# Patient Record
Sex: Female | Born: 1980 | Race: White | Hispanic: No | Marital: Single | Smoking: Current every day smoker
Health system: Southern US, Community
[De-identification: ages and names within clinical notes are randomized; demographics above are authoritative.]

---

## 2015-04-01 ENCOUNTER — Ambulatory Visit (INDEPENDENT_AMBULATORY_CARE_PROVIDER_SITE_OTHER): Payer: BLUE CROSS/BLUE SHIELD | Admitting: Physician Assistant

## 2015-04-01 VITALS — BP 105/78 | HR 82 | Temp 97.6°F | Resp 18 | Wt 196.4 lb

## 2015-04-01 DIAGNOSIS — S39012A Strain of muscle, fascia and tendon of lower back, initial encounter: Secondary | ICD-10-CM | POA: Diagnosis not present

## 2015-04-01 MED ORDER — CYCLOBENZAPRINE HCL 5 MG PO TABS: 5.0000 mg | ORAL_TABLET | Freq: Three times a day (TID) | ORAL | Status: DC | PRN

## 2015-04-01 MED ORDER — MELOXICAM 15 MG PO TABS: 15.0000 mg | ORAL_TABLET | Freq: Every day | ORAL | Status: AC

## 2015-04-01 NOTE — Progress Notes (Signed)
Subjective:    Patient ID: Bailey Dougherty, female    DOB: 09/25/1981, 34 y.o.   MRN: [id number]  HPI  This is a 34 year old female with no PMH who is presenting with lower back pain x 1 week. Pain is bilateral but worse on right side. Report she has had a kidney infection before and this feels similar but not having any urinary symptoms like before. 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 Pain described as constant and sharp. At times it shoots down both legs. She has also had pain shoot up her back. Pain shoots down to level of knees. Denies paresthesias, weakness, problems with bowel or bladder, dysuria, urinary frequency, abdominal, fever, chills, N/V. No back injury before. She has tried ibuprofen and heat with some relief.  Review of Systems  Constitutional: Negative for fever and chills.  Gastrointestinal: Negative for nausea, vomiting, abdominal pain and diarrhea.  Genitourinary: Negative for dysuria, frequency, hematuria, vaginal bleeding and vaginal discharge.  Musculoskeletal: Positive for back pain. Negative for neck pain.  Skin: Negative for color change and rash.  Neurological: Negative for weakness and numbness.  Hematological: Negative for adenopathy.  Psychiatric/Behavioral: Positive for sleep disturbance.    There are no active problems to display for this patient.  Prior to Admission medications   Not on File   No Known Allergies  Patient's social and family history were reviewed.     Objective:   Physical Exam  Constitutional: She is oriented to person, place, and time. She appears well-developed and well-nourished. No distress.  HENT:  Head: Normocephalic and atraumatic.  Right Ear: Hearing normal.  Left Ear: Hearing normal.  Nose: Nose normal.  Eyes: Conjunctivae and lids are normal. Right eye exhibits no discharge. Left eye exhibits no discharge. No scleral icterus.  Cardiovascular: Normal rate,  regular rhythm, normal heart sounds and normal pulses.   No murmur heard. Pulmonary/Chest: Effort normal and breath sounds normal. No respiratory distress. She has no wheezes. She has no rhonchi. She has no rales.  Abdominal: Soft. Normal appearance. There is no tenderness. There is no CVA tenderness.  Musculoskeletal:       Lumbar back: She exhibits decreased range of motion (flexion and extension d/t pain). She exhibits no tenderness and no bony tenderness.  Straight leg raise negative for sciatic pain but did reproduce back pain.  Neurological: She is alert and oriented to person, place, and time. She has normal strength and normal reflexes. No sensory deficit. Gait normal.  Skin: Skin is warm, dry and intact. No lesion and no rash noted.  Psychiatric: She has a normal mood and affect. Her speech is normal and behavior is normal. Thought content normal.   BP 105/78 mmHg  Pulse 82  Temp(Src) 97.6 F (36.4 C) (Oral)  Resp 18  Wt 196 lb 6.4 oz (89.086 kg)  SpO2 98%  LMP 02/16/2015     Assessment & Plan:  1. Lumbar strain, initial encounter Pt likely has lumbar strain. Flexeril and mobic prescribed. Counseled on heat, gentle massage and gentle stretching. Wrote work note to stay out rest of week. She will return if symptoms are not improving in 2 weeks.  - cyclobenzaprine (FLEXERIL) 5 MG tablet; Take 1 tablet (5 mg total) by mouth 3 (three) times daily as needed for muscle spasms.  Dispense: 30 tablet; Refill: 0 - meloxicam (MOBIC) 15 MG tablet; Take 1 tablet (15 mg total)  by mouth daily.  Dispense: 30 tablet; Refill: 0   Roswell MinersNicole V. Dyke BrackettBush, PA-C, [facility]  04/01/2015

## 2015-04-01 NOTE — Patient Instructions (Signed)
Take mobic once a day - no other nsaids with this. You may take tylenol on top of mobic for additional pain control. Flexeril three times a day for muscle spasm. May make you drowsy. If makes you too tired, just take at night. Continue heat. Gentle massage and gentle stretching can help. Return if your symptoms are not improving in 2 weeks.

## 2015-04-08 ENCOUNTER — Ambulatory Visit (INDEPENDENT_AMBULATORY_CARE_PROVIDER_SITE_OTHER): Payer: BLUE CROSS/BLUE SHIELD | Admitting: Physician Assistant

## 2015-04-08 ENCOUNTER — Ambulatory Visit (INDEPENDENT_AMBULATORY_CARE_PROVIDER_SITE_OTHER): Payer: BLUE CROSS/BLUE SHIELD

## 2015-04-08 VITALS — BP 102/84 | HR 85 | Temp 98.5°F | Resp 16 | Ht 68.0 in | Wt 193.0 lb

## 2015-04-08 DIAGNOSIS — M5440 Lumbago with sciatica, unspecified side: Secondary | ICD-10-CM

## 2015-04-08 NOTE — Patient Instructions (Signed)
Take 2 flexerils in the morning with 1 mobic. Take 1 flexeril in the afternoon and 1 flexeril in evening. You may take tylenol 1000 mg three times a day. If you need another work note, call and let me know. If you are not getting better in 4 weeks return and we will consider MRI.

## 2015-04-08 NOTE — Progress Notes (Signed)
Subjective:    Patient ID: Bailey Dougherty, female    DOB: [birth year], 34 y.o.   MRN: [id number]  HPI  This is a 34 year old female who is presenting for follow up back pain. I saw her 8 days ago for same complaint. Back pain has now been present for 2 weeks. She has been taking Mobic and Flexeril 5 mg 3 times a day for the past one week. She reports she cannot tell a difference. Her friend gave her Flexeril 10 mg which she tried once and states it helped a good bit. Heat helps. Most of her pain is in the morning and gets better throughout the day. Pain worsened by movement and turning over in the bed. She gets occasional shooting pain into her buttocks and down the side of her thighs to level of knee. She denies weakness, paresthesias, problems with bowel or bladder. She has been out of work for the past one week. She is a dialysis tech. Overall her pain is improved but not resolved and she is worried about worsening it by going back to work. She is wondering if she needs an MRI.  Review of Systems  Constitutional: Negative for fever and chills.  Respiratory: Negative for cough and shortness of breath.   Gastrointestinal: Negative for nausea, vomiting, abdominal pain and constipation.  Genitourinary: Negative for dysuria and hematuria.  Musculoskeletal: Positive for back pain. Negative for neck pain.  Skin: Negative for color change and rash.  Neurological: Negative for weakness and numbness.  Hematological: Negative for adenopathy.  Psychiatric/Behavioral: Positive for sleep disturbance.    There are no active problems to display for this patient.  Prior to Admission medications   Medication Sig Start Date End Date Taking? Authorizing Provider  cyclobenzaprine (FLEXERIL) 5 MG tablet Take 1 tablet (5 mg total) by mouth 3 (three) times daily as needed for muscle spasms. 04/01/15  Yes Lanier Clam V, PA-C  meloxicam (MOBIC) 15 MG tablet Take 1 tablet (15 mg total) by mouth daily. 04/01/15  Yes  Lanier Clam V, PA-C   No Known Allergies  Patient's social and family history were reviewed.     Objective:   Physical Exam  Constitutional: She is oriented to person, place, and time. She appears well-developed and well-nourished. No distress.  HENT:  Head: Normocephalic and atraumatic.  Right Ear: Hearing normal.  Left Ear: Hearing normal.  Nose: Nose normal.  Eyes: Conjunctivae and lids are normal. Right eye exhibits no discharge. Left eye exhibits no discharge. No scleral icterus.  Cardiovascular: Normal rate, regular rhythm, normal heart sounds, intact distal pulses and normal pulses.   No murmur heard. Pulmonary/Chest: Effort normal and breath sounds normal. No respiratory distress. She has no wheezes. She has no rhonchi. She has no rales.  Abdominal: Soft. Normal appearance. There is no tenderness. There is no CVA tenderness.  Musculoskeletal:       Lumbar back: She exhibits decreased range of motion (flexion and extension). She exhibits no tenderness and no bony tenderness.  Straight leg raise negative. Last visit straight leg raise produced significant low back pain. This visit, it produced mild low back pain.  Neurological: She is alert and oriented to person, place, and time. She has normal strength and normal reflexes. No sensory deficit. Gait normal.  Skin: Skin is warm, dry and intact. No lesion and no rash noted.  Psychiatric: She has a normal mood and affect. Her speech is normal and behavior is normal. Thought content normal.  BP 102/84 mmHg  Pulse 85  Temp(Src) 98.5 F (36.9 C) (Oral)  Resp 16  Ht 5\' 8"  (1.727 m)  Wt 193 lb (87.544 kg)  BMI 29.35 kg/m2  SpO2 96%  LMP 02/16/2015  [facility] reading (PRIMARY) by  Dr. Clelia CroftShaw: negative     Assessment & Plan:  1. Midline low back pain with sciatica, sciatica laterality unspecified Radiographic negative. Neuro exam normal. Patient has improvement in symptoms. She will continue with Mobic once a day. She will take  Flexeril 10 mg in the morning, 5 mg in the afternoon and evening. She will add Tylenol 1000 mg 3 times a day. She will continue to stay out of work for the next one week. If she needs additional time off work she will call. If no improvement in 4 weeks, will send for MRI. I expect full recovery.  - DG Lumbar Spine Complete; Future   [place] MinersNicole V. Dyke BrackettBush, PA-C, [facility]  04/09/2015

## 2015-04-16 ENCOUNTER — Telehealth: Payer: Self-pay

## 2015-04-16 DIAGNOSIS — S39012A Strain of muscle, fascia and tendon of lower back, initial encounter: Secondary | ICD-10-CM

## 2015-04-16 NOTE — Telephone Encounter (Signed)
PATIENT STATES SHE SAW NICOLE BUSH ABOUT 1 WEEK AGO FOR BACK PAIN. NICOLE TOLD HER TO CALL HER BACK IF SHE NEEDED ANYTHING. Leba NEEDS A REFILL ON FLEXORIL 5 MG AND MOBIC 15 MG. SHE ALSO NEEDS NICOLE TO WRITE HER A NOTE TO BE OUT OF WORK FROM WED. 5/4/216 UNTIL SAT. 04/20/2015. PLEASE CALL HER WHEN IT HAS BEEN WRITTEN. BEST PHONE [phone] (CELL)  PHARMACY CHOICE IS [facility] ON [address].  MBC

## 2015-04-16 NOTE — Telephone Encounter (Signed)
nicole- Please advise on refills and note out of work.

## 2015-04-16 NOTE — Telephone Encounter (Signed)
Pt states she have some FMLA papers to bring over for Lanier Clamicole Bush, was told to bring them over along with $15.00 and give to Lauren or [place] for [place] However she would like Joni Reiningicole to call her at [phone]

## 2015-04-16 NOTE — Telephone Encounter (Signed)
lmom to cb. 

## 2015-04-17 ENCOUNTER — Telehealth: Payer: Self-pay

## 2015-04-17 DIAGNOSIS — M5442 Lumbago with sciatica, left side: Secondary | ICD-10-CM

## 2015-04-17 NOTE — Telephone Encounter (Signed)
Pt dropped off FMLA ppw on 04/16/15 for completion by [name] in 5-7 business days.  Once Completed please return to disability box located by checkout at [address]. Blank copies have been scanned into chart under release #[id number]. Once returned Jasmine or myself will scan completed forms into Release #[id number], fufill release, and contact patient @ [phone] for pick up.

## 2015-04-19 MED ORDER — CYCLOBENZAPRINE HCL 10 MG PO TABS: 10.0000 mg | ORAL_TABLET | Freq: Three times a day (TID) | ORAL | Status: AC | PRN

## 2015-04-19 MED ORDER — CYCLOBENZAPRINE HCL 5 MG PO TABS
5.0000 mg | ORAL_TABLET | Freq: Three times a day (TID) | ORAL | Status: DC | PRN
Start: 2015-04-19 — End: 2015-04-19

## 2015-04-19 NOTE — Telephone Encounter (Signed)
Rx printed instead of sending electronically. I faxed to pharm.

## 2015-04-19 NOTE — Telephone Encounter (Signed)
Flexeril refilled. Asked pt to call me back and let me know how her pain is progressing before I write her a work note. Will fill out FMLA after we speak as well.

## 2015-04-21 ENCOUNTER — Telehealth: Payer: Self-pay | Admitting: *Deleted

## 2015-04-21 NOTE — Telephone Encounter (Signed)
Bailey Dougherty was called and an VM was left with number [phone] to advised her that the pt Bailey Dougherty paperwork was ready for pickup.    Bailey Dougherty gave [name] and I [name] an verbally approval that Bailey Dougherty was allowed to pick up her paper work.

## 2015-04-22 NOTE — Telephone Encounter (Signed)
Release closed on 5/8 by [facility], states patient came to office and picked up forms. Unfortunately, no forms scanned into Patient chart.

## 2015-04-26 DIAGNOSIS — Z0271 Encounter for disability determination: Secondary | ICD-10-CM

## 2015-06-19 NOTE — Telephone Encounter (Signed)
error 

## 2017-05-11 ENCOUNTER — Emergency Department (HOSPITAL_COMMUNITY): Payer: Self-pay

## 2017-05-11 ENCOUNTER — Encounter (HOSPITAL_COMMUNITY): Payer: Self-pay | Admitting: Emergency Medicine

## 2017-05-11 ENCOUNTER — Emergency Department (HOSPITAL_COMMUNITY)
Admission: EM | Admit: 2017-05-11 | Discharge: 2017-05-11 | Disposition: A | Discharge status: Home / Self Care | Payer: Self-pay | Attending: Emergency Medicine | Admitting: Emergency Medicine

## 2017-05-11 DIAGNOSIS — Z3A00 Weeks of gestation of pregnancy not specified: Secondary | ICD-10-CM | POA: Insufficient documentation

## 2017-05-11 DIAGNOSIS — F17210 Nicotine dependence, cigarettes, uncomplicated: Secondary | ICD-10-CM | POA: Insufficient documentation

## 2017-05-11 DIAGNOSIS — O200 Threatened abortion: Secondary | ICD-10-CM | POA: Insufficient documentation

## 2017-05-11 DIAGNOSIS — O99330 Smoking (tobacco) complicating pregnancy, unspecified trimester: Secondary | ICD-10-CM | POA: Insufficient documentation

## 2017-05-11 LAB — CBC WITH DIFFERENTIAL/PLATELET
BASOS ABS: 0 10*3/uL (ref 0.0–0.1)
BASOS PCT: 0 %
Eosinophils Absolute: 0 10*3/uL (ref 0.0–0.7)
Eosinophils Relative: 0 %
HEMATOCRIT: 42.2 % (ref 36.0–46.0)
HEMOGLOBIN: 14.7 g/dL (ref 12.0–15.0)
Lymphocytes Relative: 19 %
Lymphs Abs: 1.7 10*3/uL (ref 0.7–4.0)
MCH: 30.9 pg (ref 26.0–34.0)
MCHC: 34.8 g/dL (ref 30.0–36.0)
MCV: 88.8 fL (ref 78.0–100.0)
Monocytes Absolute: 0.6 10*3/uL (ref 0.1–1.0)
Monocytes Relative: 7 %
NEUTROS ABS: 6.6 10*3/uL (ref 1.7–7.7)
NEUTROS PCT: 74 %
Platelets: 209 10*3/uL (ref 150–400)
RBC: 4.75 MIL/uL (ref 3.87–5.11)
RDW: 13.1 % (ref 11.5–15.5)
WBC: 9 10*3/uL (ref 4.0–10.5)

## 2017-05-11 LAB — BASIC METABOLIC PANEL
ANION GAP: 10 (ref 5–15)
BUN: 8 mg/dL (ref 6–20)
CHLORIDE: 107 mmol/L (ref 101–111)
CO2: 21 mmol/L — AB (ref 22–32)
Calcium: 9.9 mg/dL (ref 8.9–10.3)
Creatinine, Ser: 0.83 mg/dL (ref 0.44–1.00)
GFR calc non Af Amer: >60 mL/min (ref >60)
Glucose, Bld: 114 mg/dL — ABNORMAL HIGH (ref 65–99)
POTASSIUM: 3.8 mmol/L (ref 3.5–5.1)
Sodium: 138 mmol/L (ref 135–145)

## 2017-05-11 LAB — HCG, QUANTITATIVE, PREGNANCY: HCG, BETA CHAIN, QUANT, S: 795 m[IU]/mL — AB (ref <5)

## 2017-05-11 NOTE — ED Triage Notes (Signed)
Patient reports that she had positive pregnancy test 4 days ago and starting last night had lower abd cramping and then vaginal bleeding which has gotten lighter since around 3pm today. Patient reports only changing menstrual pad 3 times today and never heavily saturated either time.

## 2017-05-11 NOTE — Discharge Instructions (Signed)
[facility] HAVE YOU HORMONE LEVELS RECHECKED.  BE SEEN SOONER IF YOU HAVE ANY WORSENING PAIN, HEAVIER BLEEDING, DIZZINESS, OR OTHER WORSENING SYMPTOMS.

## 2017-05-11 NOTE — ED Provider Notes (Signed)
[facility]-EMERGENCY DEPT Provider Note   CSN: [id number] Arrival date & time: 05/11/17  1801     History   Chief Complaint Chief Complaint  Patient presents with  . Abdominal Cramping  . Vaginal Bleeding    HPI Rula Keniston is a 36 y.o. female.  Patient is a 77 year of female who is G3 P2 with one elective abortion in 25 children who presents with vaginal bleeding. She states that her last menstrual period was April 22. She took a home pregnancy test that was positive. She started having some left lower abdominal pain and cramping that got more intense last night with associated vaginal bleeding. She states it was heavier earlier today but now is slowed down. She states her pain has eased off as well. She denies any nausea or vomiting. No fevers. No urinary symptoms.      History reviewed. No pertinent past medical history.  There are no active problems to display for this patient.   History reviewed. No pertinent surgical history.  OB History    Gravida Para Term Preterm AB Living   1             SAB TAB Ectopic Multiple Live Births                   Home Medications    Prior to Admission medications   Medication Sig Start Date End Date Taking? Authorizing Provider  ibuprofen (ADVIL,MOTRIN) 800 MG tablet Take 800 mg by mouth every 8 (eight) hours as needed for mild pain or moderate pain.   Yes [provider]  cyclobenzaprine (FLEXERIL) 10 MG tablet Take 1 tablet (10 mg total) by mouth 3 (three) times daily as needed for muscle spasms. Patient not taking: Reported on 05/11/2017 04/19/15   Dorna Leitz, PA-C  meloxicam (MOBIC) 15 MG tablet Take 1 tablet (15 mg total) by mouth daily. Patient not taking: Reported on 05/11/2017 04/01/15   [place] Mam    Family History No family history on file.  Social History Social History  Substance Use Topics  . Smoking status: Current Every Day Smoker    Packs/day: 1.00    Years: 17.00    Types: Cigarettes  .  Smokeless tobacco: Never Used  . Alcohol use Not on file     Allergies   Patient has no known allergies.   Review of Systems Review of Systems  Constitutional: Negative for chills, diaphoresis, fatigue and fever.  HENT: Negative for congestion, rhinorrhea and sneezing.   Eyes: Negative.   Respiratory: Negative for cough, chest tightness and shortness of breath.   Cardiovascular: Negative for chest pain and leg swelling.  Gastrointestinal: Positive for abdominal pain. Negative for blood in stool, diarrhea, nausea and vomiting.  Genitourinary: Positive for pelvic pain and vaginal bleeding. Negative for difficulty urinating, flank pain, frequency, hematuria and vaginal discharge.  Musculoskeletal: Negative for arthralgias and back pain.  Skin: Negative for rash.  Neurological: Negative for dizziness, speech difficulty, weakness, numbness and headaches.     Physical Exam Updated Vital Signs BP (!) 117/47 (BP Location: Right Arm)   Pulse 68   Temp 98.9 F (37.2 C) (Oral)   Resp 12   Ht 5' 7.5" (1.715 m)   Wt 97.5 kg (215 lb)   LMP 04/04/2017   SpO2 97%   BMI 33.18 kg/m   Physical Exam  Constitutional: She is oriented to person, place, and time. She appears well-developed and well-nourished.  HENT:  Head: Normocephalic  and atraumatic.  Eyes: Pupils are equal, round, and reactive to light.  Neck: Normal range of motion. Neck supple.  Cardiovascular: Normal rate, regular rhythm and normal heart sounds.   Pulmonary/Chest: Effort normal and breath sounds normal. No respiratory distress. She has no wheezes. She has no rales. She exhibits no tenderness.  Abdominal: Soft. Bowel sounds are normal. There is tenderness (Mild tenderness to the left lower quadrant). There is no rebound and no guarding.  Genitourinary:  Genitourinary Comments: Small amount of dark blood in the vault. No cervical motion tenderness or adnexal tenderness cervix is closed.  Musculoskeletal: Normal range of  motion. She exhibits no edema.  Lymphadenopathy:    She has no cervical adenopathy.  Neurological: She is alert and oriented to person, place, and time.  Skin: Skin is warm and dry. No rash noted.  Psychiatric: She has a normal mood and affect.     ED Treatments / Results  Labs (all labs ordered are listed, but only abnormal results are displayed) Labs Reviewed  HCG, QUANTITATIVE, PREGNANCY - Abnormal; Notable for the following:       Result Value   hCG, Beta Chain, Quant, S 795 (*)    All other components within normal limits  BASIC METABOLIC PANEL - Abnormal; Notable for the following:    CO2 21 (*)    Glucose, Bld 114 (*)    All other components within normal limits  CBC WITH DIFFERENTIAL/PLATELET    EKG  EKG Interpretation None       Radiology [place] Ob Comp < 14 Wks  Result Date: 05/11/2017 CLINICAL DATA:  Abdominal pain and cramping. Estimated gestational age by last menstrual period equals 5 weeks 2 days. HCG equals 795. EXAM: OBSTETRIC <14 WK US AND TRANSVAGINAL OB US TECHNIQUE: Both transabdominal and transvaginal ultrasound examinations were performed for complete evaluation of the gestation as well as the maternal uterus, adnexal regions, and pelvic cul-de-sac. Transvaginal technique was performed to assess early pregnancy. COMPARISON:  None. FINDINGS: Intrauterine gestational sac: Not identified Yolk sac:  Not identified Embryo:  Not identified Subchorionic hemorrhage:  None Maternal uterus/adnexae: Normal ovaries.  Trace free fluid IMPRESSION: No intrauterine gestational sac, yolk sac, or fetal pole identified. Differential considerations include intrauterine pregnancy too early to be sonographically visualized, missed abortion, or ectopic pregnancy. Followup ultrasound is recommended in 10-14 days for further evaluation. Electronically Signed   By: Genevive BiStewart  Edmunds M.D.   On: 05/11/2017 20:47   [place] Ob Transvaginal  Result Date: 05/11/2017 CLINICAL DATA:  Abdominal pain  and cramping. Estimated gestational age by last menstrual period equals 5 weeks 2 days. HCG equals 795. EXAM: OBSTETRIC <14 WK US AND TRANSVAGINAL OB US TECHNIQUE: Both transabdominal and transvaginal ultrasound examinations were performed for complete evaluation of the gestation as well as the maternal uterus, adnexal regions, and pelvic cul-de-sac. Transvaginal technique was performed to assess early pregnancy. COMPARISON:  None. FINDINGS: Intrauterine gestational sac: Not identified Yolk sac:  Not identified Embryo:  Not identified Subchorionic hemorrhage:  None Maternal uterus/adnexae: Normal ovaries.  Trace free fluid IMPRESSION: No intrauterine gestational sac, yolk sac, or fetal pole identified. Differential considerations include intrauterine pregnancy too early to be sonographically visualized, missed abortion, or ectopic pregnancy. Followup ultrasound is recommended in 10-14 days for further evaluation. Electronically Signed   By: Genevive BiStewart  Edmunds M.D.   On: 05/11/2017 20:47    Procedures Procedures (including critical care time)  Medications Ordered in ED Medications - No data to display   Initial Impression /  Assessment and Plan / ED Course  I have reviewed the triage vital signs and the nursing notes.  Pertinent labs & imaging results that were available during my care of the patient were reviewed by me and considered in my medical decision making (see chart for details).     Patient presents with left-sided abdominal pain and vaginal bleeding. She had no  pain on pelvic exam. Her ultrasound showed an empty uterus and no obvious ectopic. Her Quant is 795. Her vital signs are stable. She was discharged home in good condition. She was advised that she needs to have a repeat Quant in 2-3 days. She can do this at the women's outpatient center or the MAU at the [facility]. She was given strict return precautions. Of note I didn't realize that she did not have a type and screen prior to  discharge. Patient states that she never had to get Rhogam with prior pregnancies. She says that her blood type is A+. She does not want to stay and have this checked tonight.  Final Clinical Impressions(s) / ED Diagnoses   Final diagnoses:  Threatened miscarriage    New Prescriptions Discharge Medication List as of 05/11/2017  9:59 PM       Rolan Bucco, MD 05/12/17 1610

## 2017-05-13 ENCOUNTER — Telehealth: Payer: Self-pay | Admitting: General Practice

## 2017-05-13 ENCOUNTER — Other Ambulatory Visit: Payer: Self-pay

## 2017-05-13 DIAGNOSIS — O3680X0 Pregnancy with inconclusive fetal viability, not applicable or unspecified: Secondary | ICD-10-CM

## 2017-05-13 LAB — HCG, QUANTITATIVE, PREGNANCY: HCG, BETA CHAIN, QUANT, S: 249 m[IU]/mL — AB (ref <5)

## 2017-05-13 NOTE — Progress Notes (Unsigned)
Patient here for stat bhcg. Patient denies pain and is bleeding like a period. Discussed results with Dr Vergie LivingPickens who states drop in bhcg levels indicate SAB. Patient needs follow up labs in 1 week and provider follow up in 2 weeks. Also needs abo/rh. Will call patient

## 2017-05-13 NOTE — Telephone Encounter (Signed)
Called patient regarding bhcg results & recommended follow up. Patient verbalized understanding & states she can come 6/7 @ 9am for repeat. Patient will come tomorrow for abo/rh. Patient had no questions

## 2017-06-10 ENCOUNTER — Encounter: Payer: Medicaid Other | Admitting: Obstetrics and Gynecology

## 2019-04-29 IMAGING — US US OB TRANSVAGINAL
1 series · 14 of 28 positions shown · non-contrast
Comparison: None.

CLINICAL DATA: Abdominal pain and cramping. Estimated gestational

EXAM:
OBSTETRIC <14 WK US AND TRANSVAGINAL OB US
TECHNIQUE: Both transabdominal and transvaginal ultrasound examinations were
performed for complete evaluation of the gestation as well as the
maternal uterus, adnexal regions, and pelvic cul-de-sac.
Transvaginal technique was performed to assess early pregnancy.

[Series 1: us ob transvaginal · 0.14mm/px · 14 of 28 slices shown]
[im 2/28]
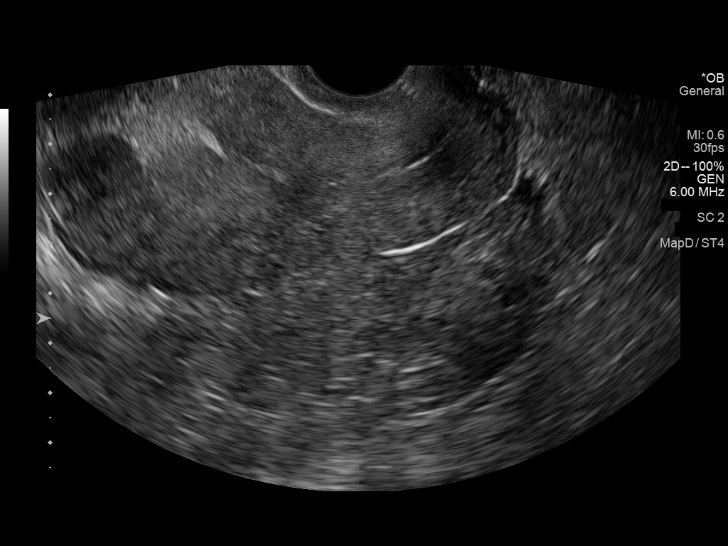
[im 4/28]
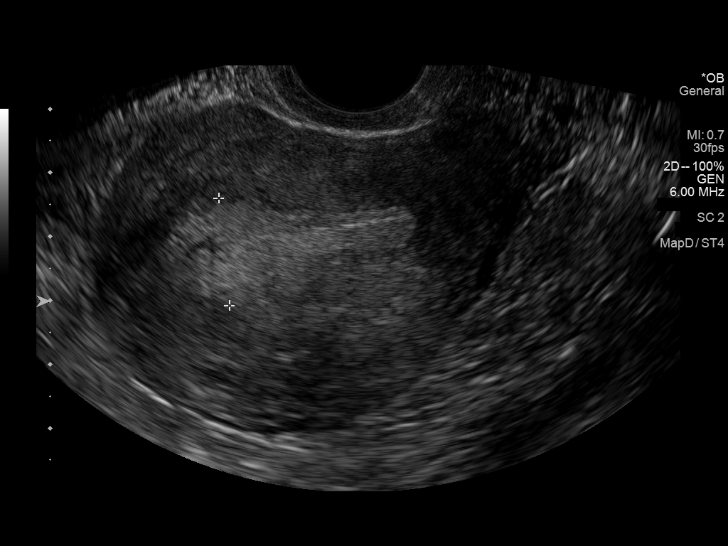
[im 6/28]
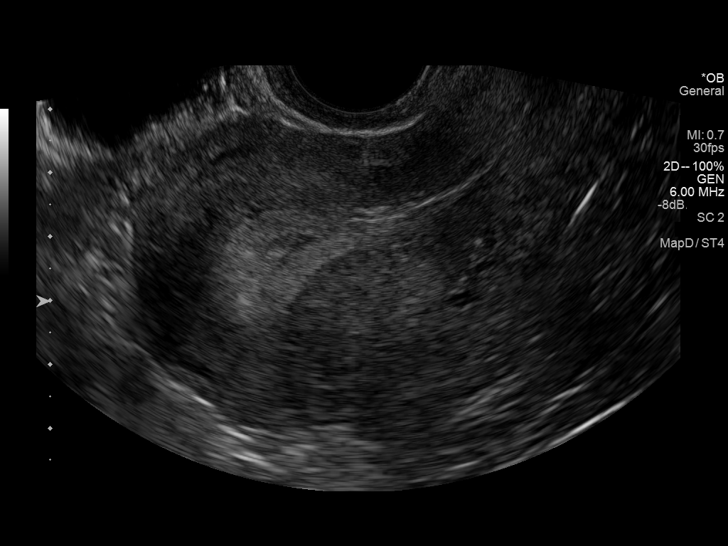
[im 8/28]
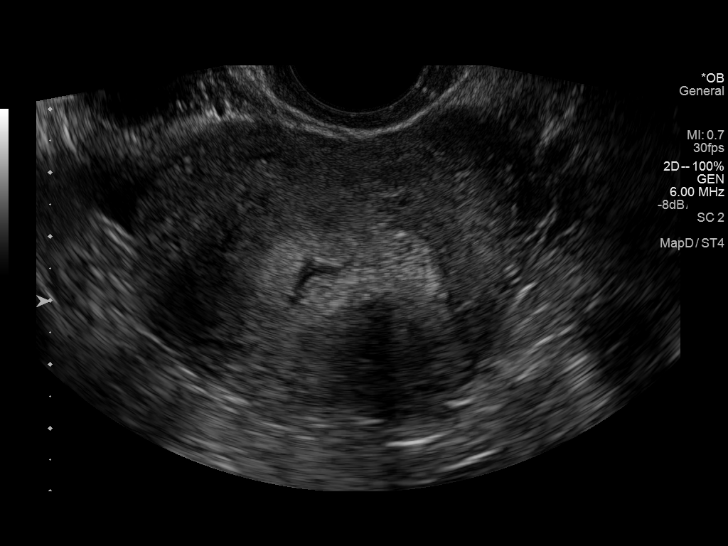
[im 10/28]
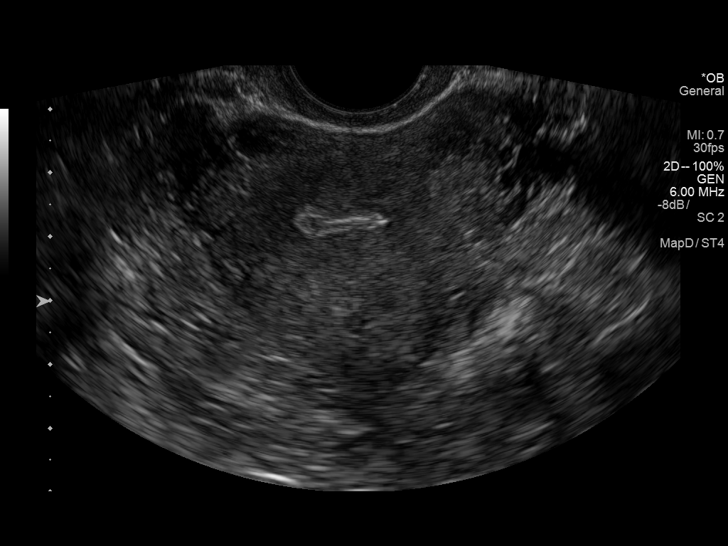
[im 12/28]
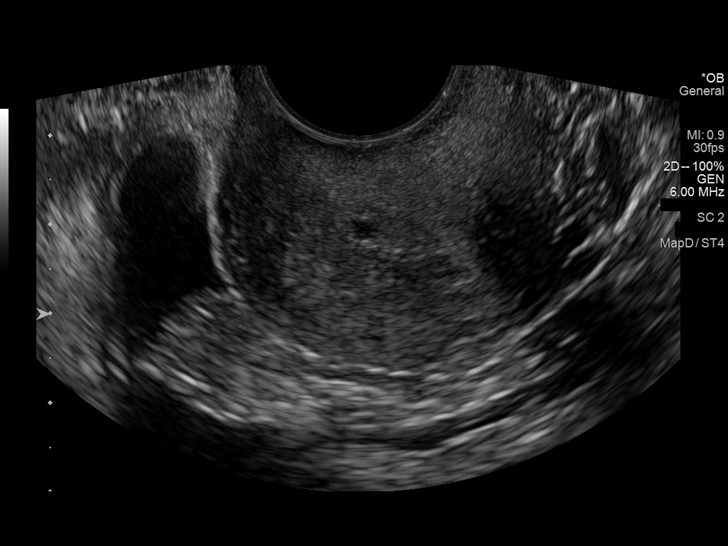
[im 14/28]
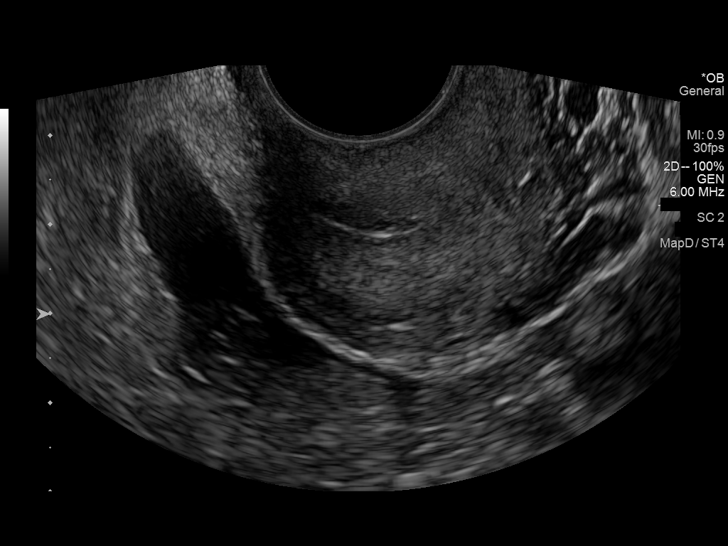
[im 16/28]
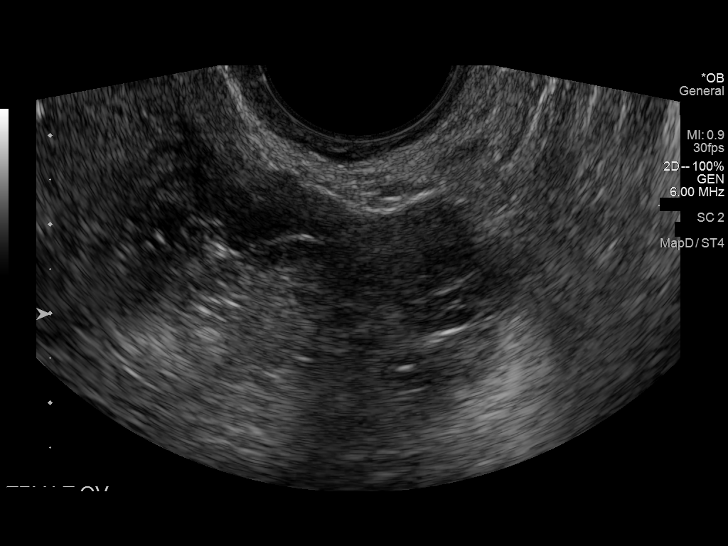
[im 18/28]
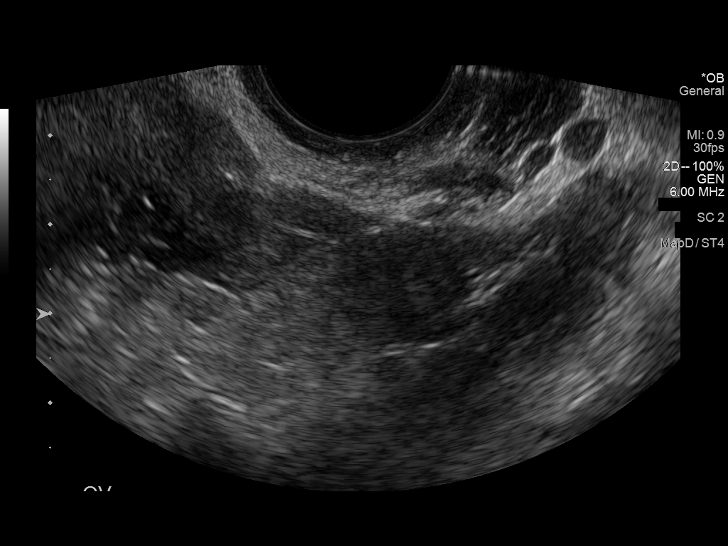
[im 20/28]
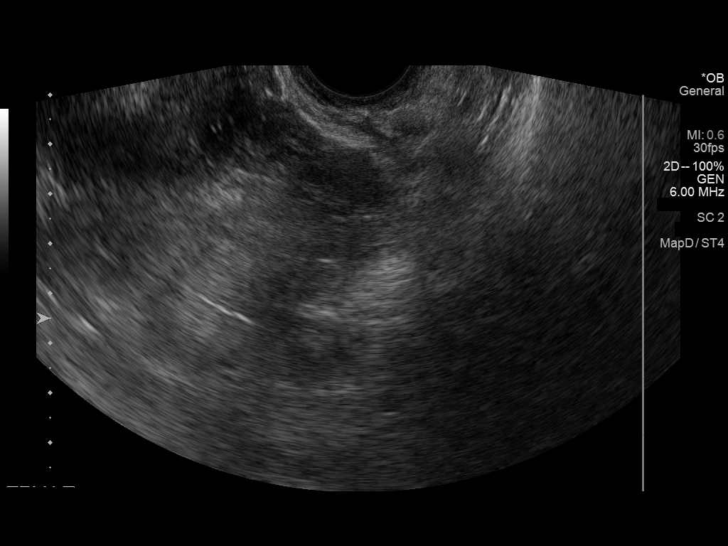
[im 22/28]
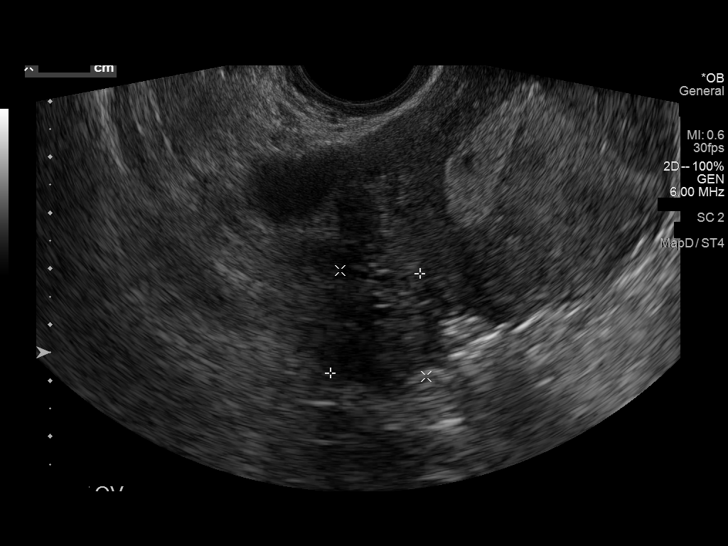
[im 24/28]
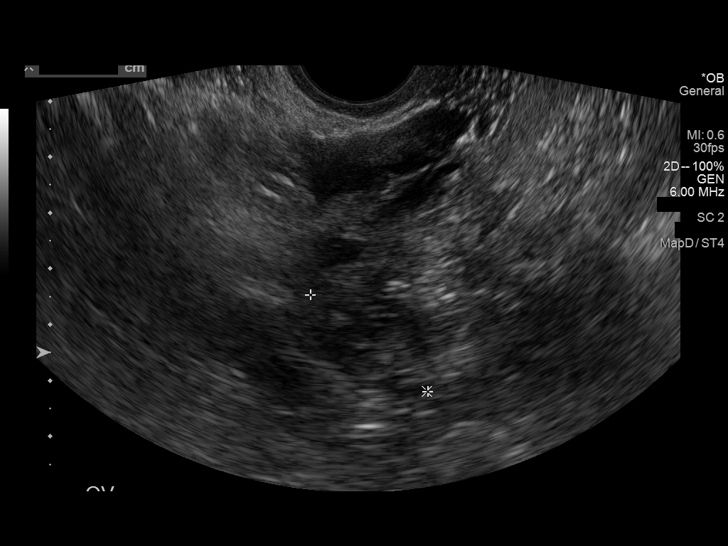
[im 26/28]
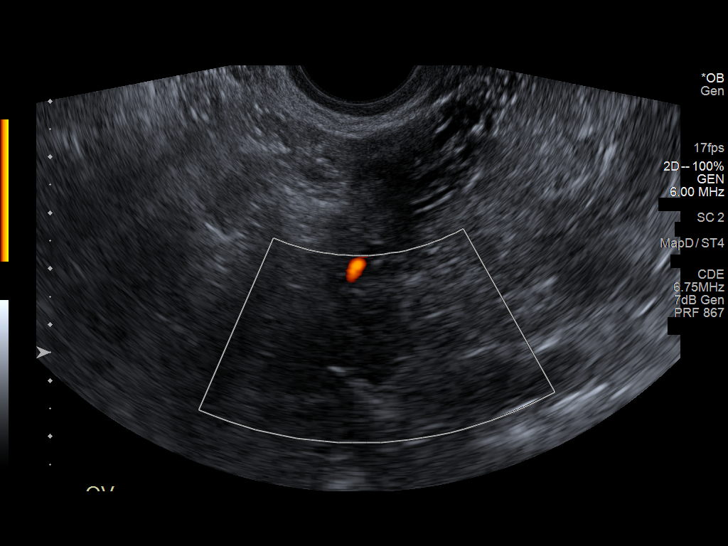
[im 28/28]
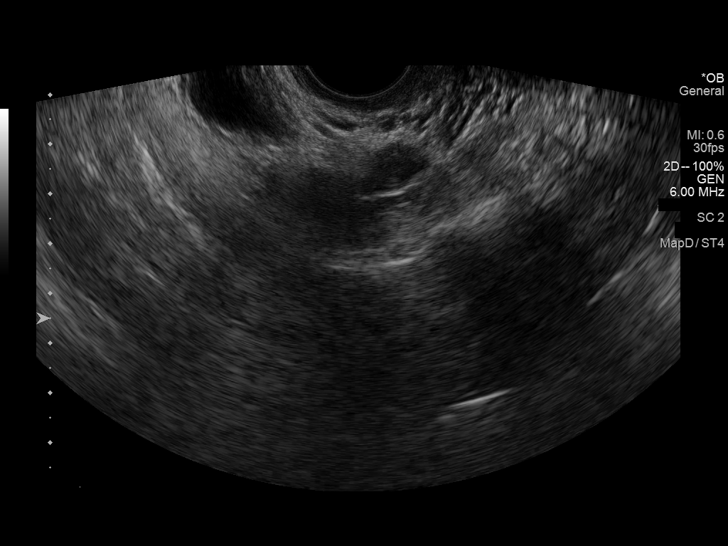

[14 of 28 positions shown; findings below may reference images not displayed]

FINDINGS: Intrauterine gestational sac: Not identified

Yolk sac:  Not identified

Embryo:  Not identified

Subchorionic hemorrhage:  None

Maternal uterus/adnexae: Normal ovaries.  Trace free fluid
IMPRESSION: No intrauterine gestational sac, yolk sac, or fetal pole identified.
Differential considerations include intrauterine pregnancy too early
to be sonographically visualized, missed abortion, or ectopic
pregnancy. Followup ultrasound is recommended in 10-14 days for
further evaluation.

## 2019-04-29 IMAGING — US US OB TRANSVAGINAL
1 series · 14 of 28 positions shown · non-contrast
Comparison: None.

CLINICAL DATA: Abdominal pain and cramping. Estimated gestational

EXAM:
OBSTETRIC <14 WK US AND TRANSVAGINAL OB US
TECHNIQUE: Both transabdominal and transvaginal ultrasound examinations were
performed for complete evaluation of the gestation as well as the
maternal uterus, adnexal regions, and pelvic cul-de-sac.
Transvaginal technique was performed to assess early pregnancy.

[Series 1: us ob transvaginal · 0.23mm/px · 14 of 28 slices shown]
[im 2/28]
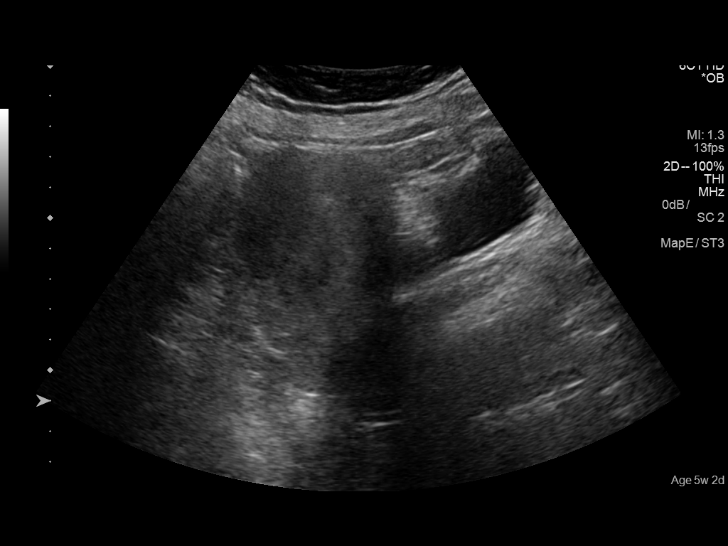
[im 4/28]
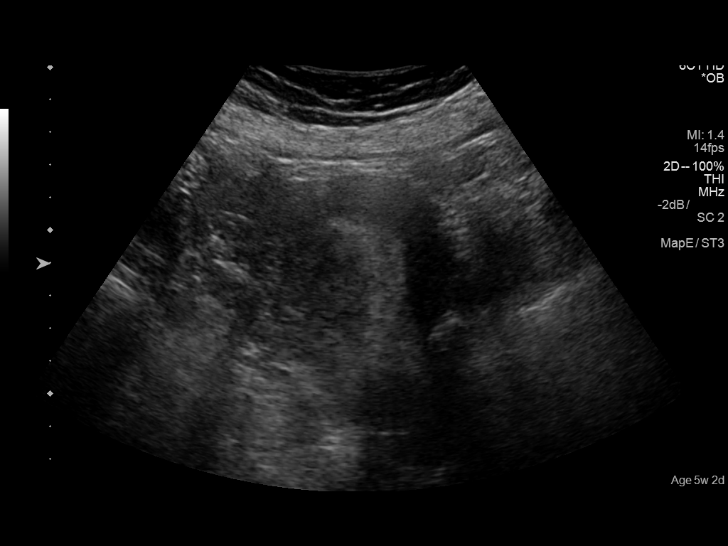
[im 6/28]
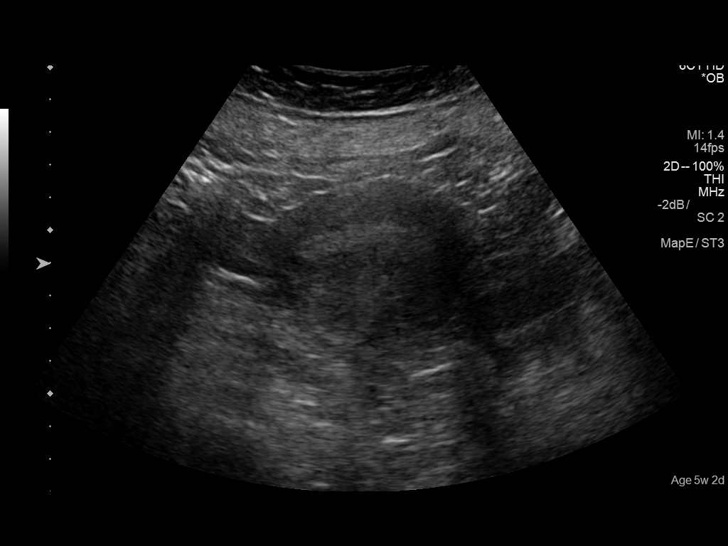
[im 8/28]
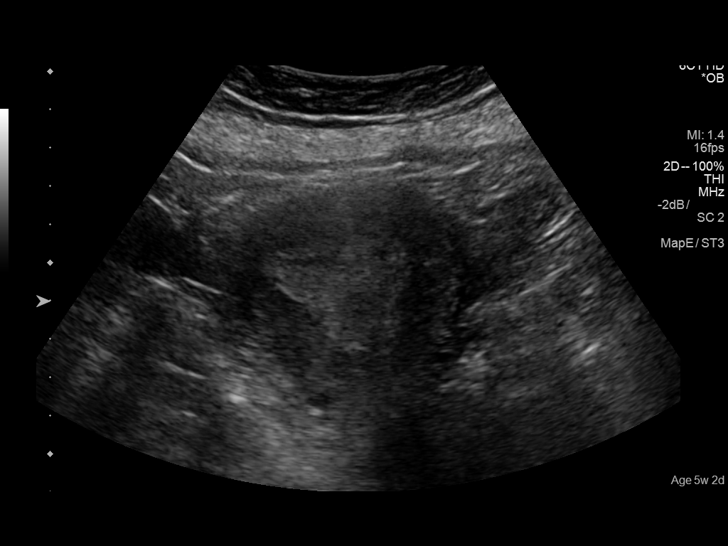
[im 10/28]
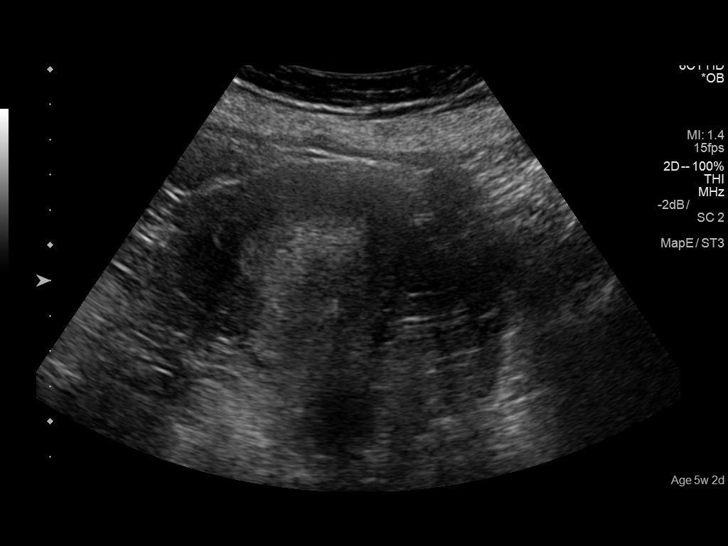
[im 12/28]
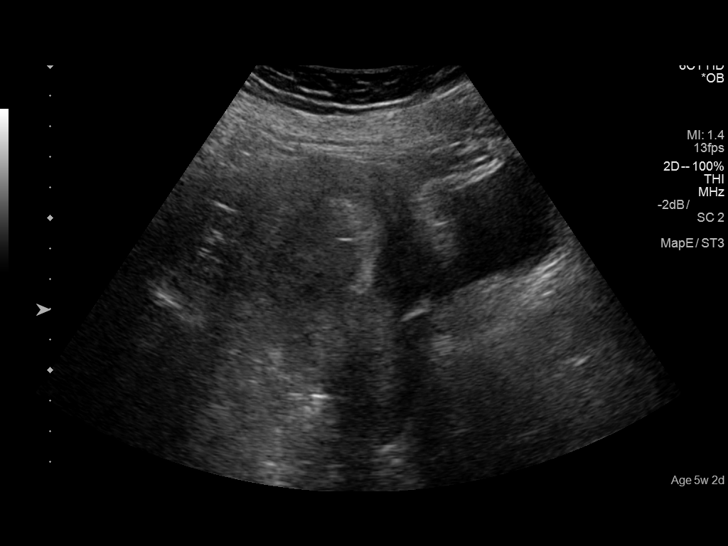
[im 14/28]
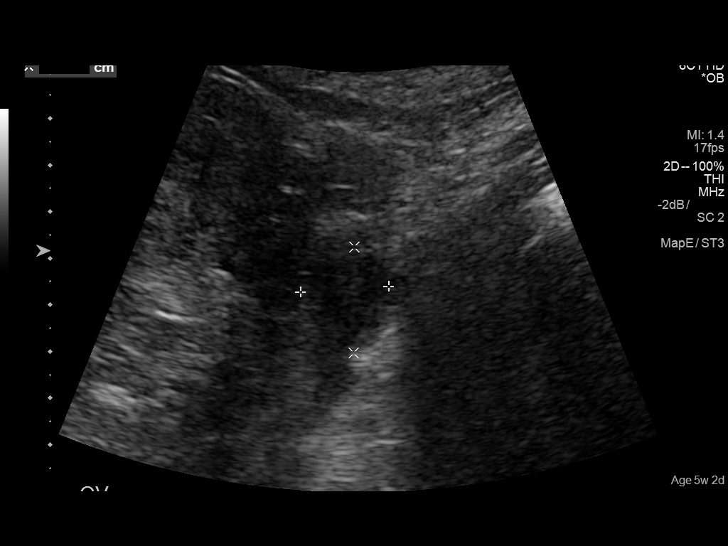
[im 16/28]
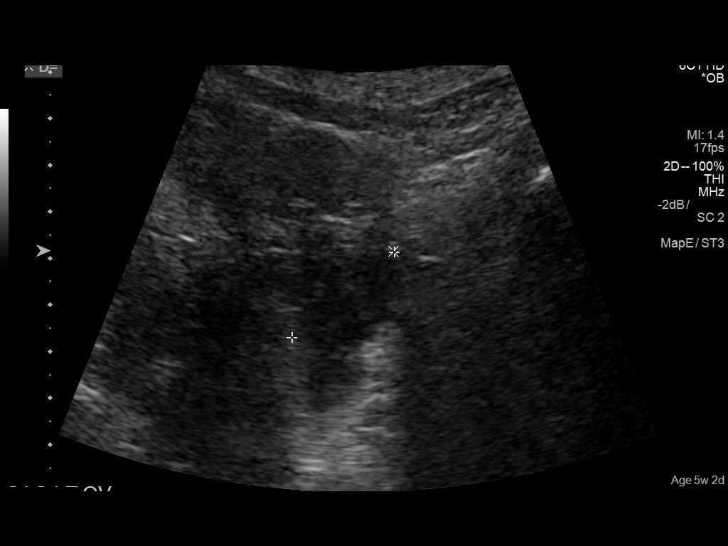
[im 18/28]
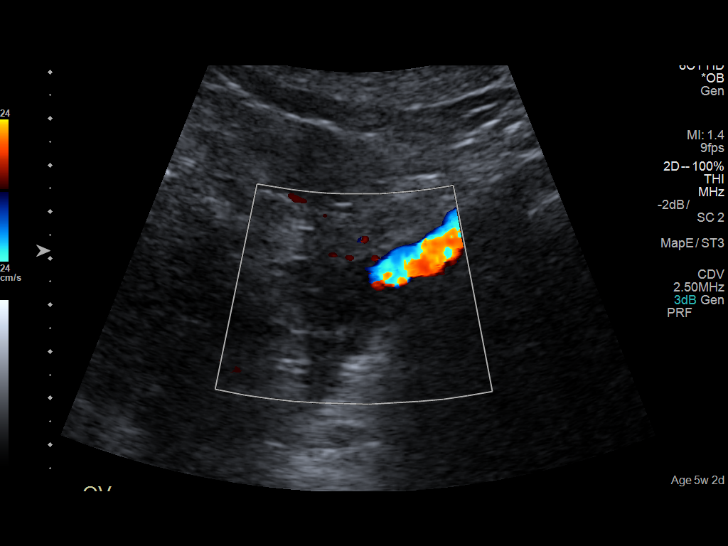
[im 20/28]
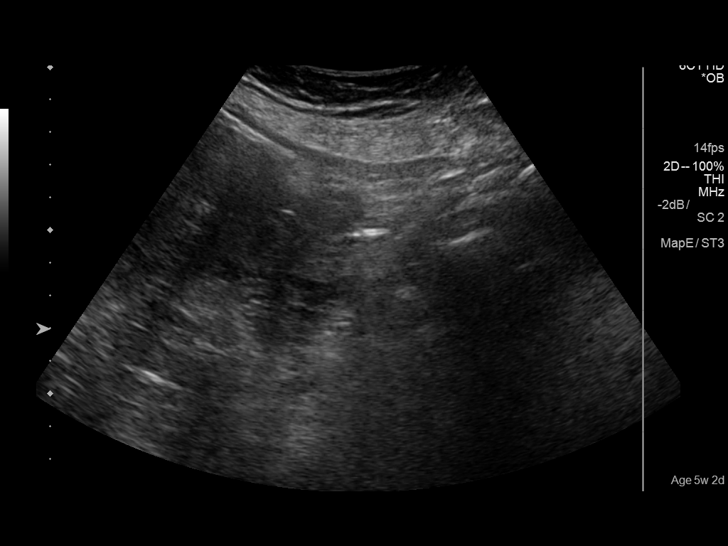
[im 22/28]
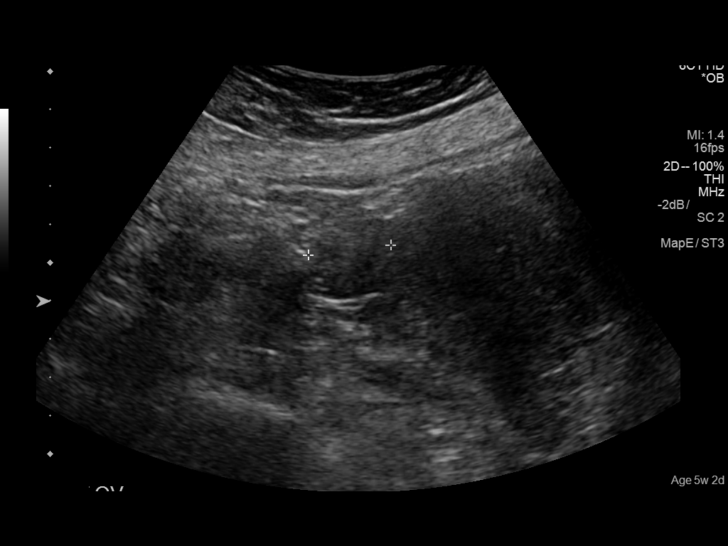
[im 24/28]
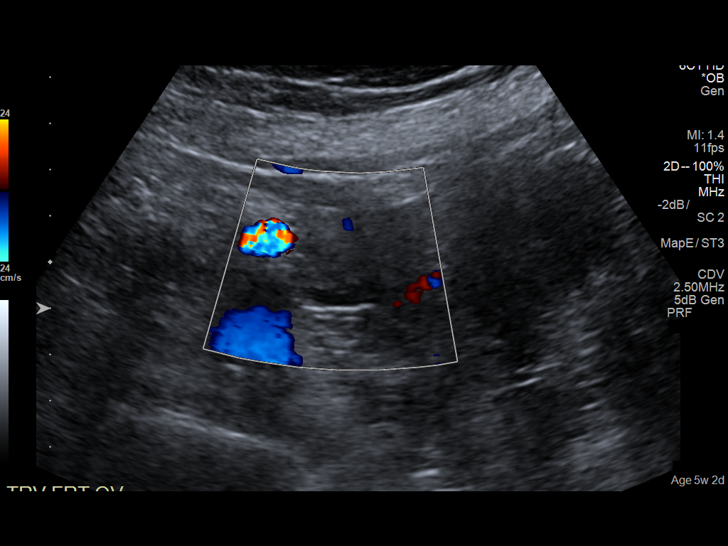
[im 26/28]
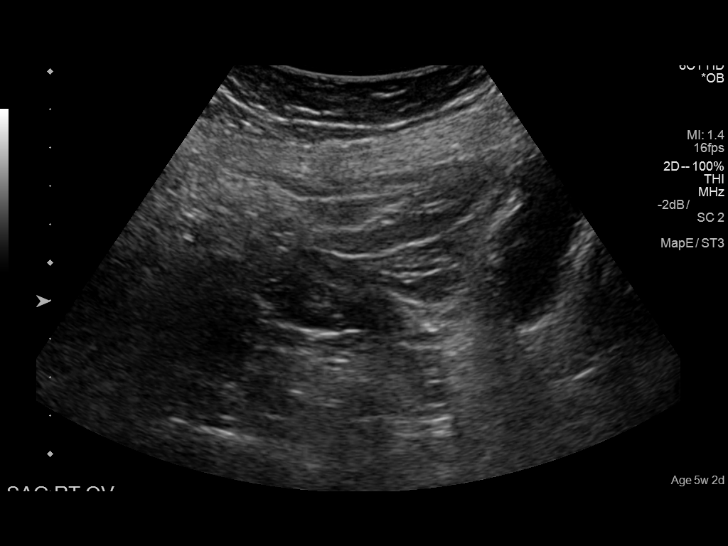
[im 28/28]
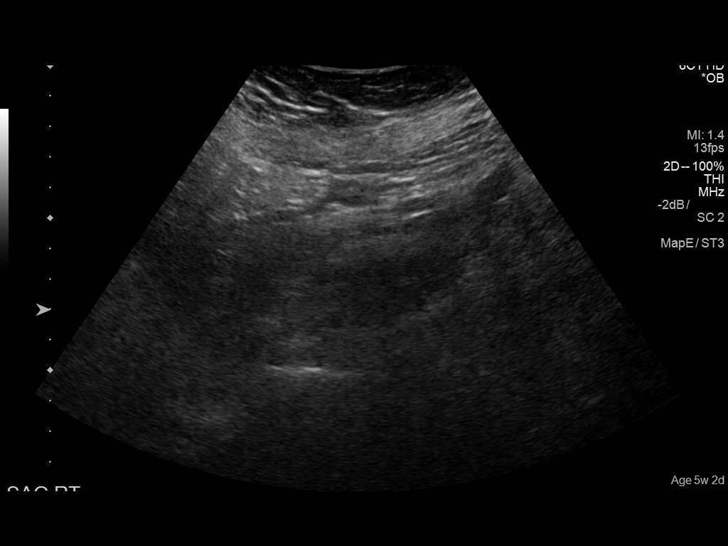

[14 of 28 positions shown; findings below may reference images not displayed]

FINDINGS: Intrauterine gestational sac: Not identified

Yolk sac:  Not identified

Embryo:  Not identified

Subchorionic hemorrhage:  None

Maternal uterus/adnexae: Normal ovaries.  Trace free fluid
IMPRESSION: No intrauterine gestational sac, yolk sac, or fetal pole identified.
Differential considerations include intrauterine pregnancy too early
to be sonographically visualized, missed abortion, or ectopic
pregnancy. Followup ultrasound is recommended in 10-14 days for
further evaluation.
